# Patient Record
Sex: Male | Born: 1965 | Race: White | Hispanic: No | Marital: Single | Smoking: Never smoker
Health system: Southern US, Community
[De-identification: ages and names within clinical notes are randomized; demographics above are authoritative.]

## PROBLEM LIST (undated history)

## (undated) DIAGNOSIS — I10 Essential (primary) hypertension: Secondary | ICD-10-CM

## (undated) DIAGNOSIS — K219 Gastro-esophageal reflux disease without esophagitis: Secondary | ICD-10-CM

---

## 2000-08-12 ENCOUNTER — Encounter: Payer: Self-pay | Admitting: Emergency Medicine

## 2000-08-12 ENCOUNTER — Emergency Department (HOSPITAL_COMMUNITY): Admission: EM | Admit: 2000-08-12 | Discharge: 2000-08-12 | Payer: Self-pay | Admitting: Emergency Medicine

## 2000-08-15 ENCOUNTER — Encounter: Payer: Self-pay | Admitting: Urology

## 2000-08-15 ENCOUNTER — Ambulatory Visit (HOSPITAL_COMMUNITY): Admission: RE | Admit: 2000-08-15 | Discharge: 2000-08-15 | Payer: Self-pay | Admitting: Urology

## 2000-08-17 ENCOUNTER — Encounter: Payer: Self-pay | Admitting: Urology

## 2000-08-17 ENCOUNTER — Ambulatory Visit (HOSPITAL_COMMUNITY): Admission: RE | Admit: 2000-08-17 | Discharge: 2000-08-17 | Payer: Self-pay | Admitting: Urology

## 2000-08-31 ENCOUNTER — Encounter: Payer: Self-pay | Admitting: Urology

## 2000-08-31 ENCOUNTER — Ambulatory Visit (HOSPITAL_COMMUNITY): Admission: RE | Admit: 2000-08-31 | Discharge: 2000-08-31 | Payer: Self-pay | Admitting: Urology

## 2004-10-27 ENCOUNTER — Ambulatory Visit: Payer: Self-pay | Admitting: Cardiology

## 2004-10-28 ENCOUNTER — Inpatient Hospital Stay (HOSPITAL_COMMUNITY): Admission: EM | Admit: 2004-10-28 | Discharge: 2004-10-28 | Payer: Self-pay | Admitting: Emergency Medicine

## 2016-06-02 ENCOUNTER — Emergency Department (HOSPITAL_BASED_OUTPATIENT_CLINIC_OR_DEPARTMENT_OTHER)
Admission: EM | Admit: 2016-06-02 | Discharge: 2016-06-02 | Disposition: A | Discharge status: Home / Self Care | Payer: BLUE CROSS/BLUE SHIELD | Attending: Emergency Medicine | Admitting: Emergency Medicine

## 2016-06-02 ENCOUNTER — Encounter (HOSPITAL_BASED_OUTPATIENT_CLINIC_OR_DEPARTMENT_OTHER): Payer: Self-pay | Admitting: *Deleted

## 2016-06-02 DIAGNOSIS — Z79899 Other long term (current) drug therapy: Secondary | ICD-10-CM | POA: Diagnosis not present

## 2016-06-02 DIAGNOSIS — I10 Essential (primary) hypertension: Secondary | ICD-10-CM | POA: Insufficient documentation

## 2016-06-02 DIAGNOSIS — M25511 Pain in right shoulder: Secondary | ICD-10-CM

## 2016-06-02 DIAGNOSIS — M79601 Pain in right arm: Secondary | ICD-10-CM | POA: Insufficient documentation

## 2016-06-02 HISTORY — DX: Gastro-esophageal reflux disease without esophagitis: K21.9

## 2016-06-02 HISTORY — DX: Essential (primary) hypertension: I10

## 2016-06-02 MED ORDER — NAPROXEN 500 MG PO TABS: 500.0000 mg | ORAL_TABLET | Freq: Two times a day (BID) | ORAL | Status: AC

## 2016-06-02 MED ORDER — IBUPROFEN 400 MG PO TABS
600.0000 mg | ORAL_TABLET | Freq: Once | ORAL | Status: AC
  Administered 2016-06-02: 600 mg via ORAL
  Filled 2016-06-02: qty 1

## 2016-06-02 NOTE — Discharge Instructions (Signed)
Medications: Naprosyn  Treatment: Take Naprosyn twice daily as prescribed for your pain. Ice your elbow and shoulder 3-4 times daily alternating 20 minutes on, 20 minutes off. Rest your shoulder and elbow as much as possible.  Follow-up: Please follow-up with Dr. Pearletha ForgeHudnall for further evaluation and treatment of your pain. Please return to emergency department if you develop any new or worsening symptoms.

## 2016-06-02 NOTE — ED Notes (Addendum)
Right upper arm pain. States his elbow and shoulder are the worse. Increases with certain movements and lifting. Limited motion.

## 2016-06-02 NOTE — ED Provider Notes (Signed)
CSN: [id number]     Arrival date & time 06/02/16  1902 History   First MD Initiated Contact with Patient 06/02/16 2026     Chief Complaint  Patient presents with  . Arm Pain     (Consider location/radiation/quality/duration/timing/severity/associated sxs/prior Treatment) HPI Comments: Patient is a 50 year old male with history of hypertension and GERD who presents with right arm pain. Patient states that he has had right shoulder pain for the past few months. It hurts especially when he lays on it. Patient states he sleeps on his stomach with his right arm above his head. Patient also reports more acute right elbow pain. Patient reports he was at work this evening and lifted a lunch box and had pain to his upper arm. Patient describes the pain as shooting to his right bicep, up and down his arm only with certain movements. The pain worsens when he supinates and flexes his elbow. Patient works for [employer] and does a lot of heavy lifting at his job. Patient denies any numbness or tingling. Patient is not tried any medications for this at home. Patient denies any injury or trauma to his elbow or shoulder. Patient also denies any chest pain, shortness of breath, abdominal pain, nausea, vomiting, dysuria.  Patient is a 50 y.o. male presenting with arm pain. The history is provided by the patient.  Arm Pain Associated symptoms include arthralgias and myalgias. Pertinent negatives include no abdominal pain, chest pain, chills, fever, headaches, nausea, rash, sore throat or vomiting.    Past Medical History  Diagnosis Date  . Hypertension   . GERD (gastroesophageal reflux disease)    History reviewed. No pertinent past surgical history. No family history on file. Social History  Substance Use Topics  . Smoking status: Never Smoker   . Smokeless tobacco: None  . Alcohol Use: Yes     Comment: monthly    Review of Systems  Constitutional: Negative for fever and chills.  HENT: Negative for facial  swelling and sore throat.   Respiratory: Negative for shortness of breath.   Cardiovascular: Negative for chest pain.  Gastrointestinal: Negative for nausea, vomiting and abdominal pain.  Genitourinary: Negative for dysuria.  Musculoskeletal: Positive for myalgias and arthralgias. Negative for back pain.  Skin: Negative for rash and wound.  Neurological: Negative for headaches.  Psychiatric/Behavioral: The patient is not nervous/anxious.       Allergies  Penicillins  Home Medications   Prior to Admission medications   Medication Sig Start Date End Date Taking? Authorizing Provider  LISINOPRIL-HYDROCHLOROTHIAZIDE PO Take by mouth.   Yes Historical Provider, MD  PANTOPRAZOLE SODIUM PO Take by mouth.   Yes Historical Provider, MD  naproxen (NAPROSYN) 500 MG tablet Take 1 tablet (500 mg total) by mouth 2 (two) times daily. 06/02/16   Rollo Farquhar M Riata Ikeda, PA-C   BP 124/74 mmHg  Pulse 95  Temp(Src) 98.2 F (36.8 C) (Oral)  Resp 20  Ht  (1.702 m)  Wt 79.379 kg  BMI 27.40 kg/m2  SpO2 98% Physical Exam  Constitutional: He appears well-developed and well-nourished. No distress.  HENT:  Head: Normocephalic and atraumatic.  Mouth/Throat: Oropharynx is clear and moist. No oropharyngeal exudate.  Eyes: Conjunctivae are normal. Pupils are equal, round, and reactive to light. Right eye exhibits no discharge. Left eye exhibits no discharge. No scleral icterus.  Neck: Normal range of motion. Neck supple. No thyromegaly present.  Cardiovascular: Normal rate, regular rhythm and normal heart sounds.  Exam reveals no gallop and no friction rub.  No murmur heard. Pulmonary/Chest: Effort normal and breath sounds normal. No stridor. No respiratory distress. He has no wheezes. He has no rales.  Abdominal: Soft. Bowel sounds are normal. He exhibits no distension. There is no tenderness. There is no rebound and no guarding.  Musculoskeletal: He exhibits no edema.       Right shoulder: He exhibits  tenderness. He exhibits normal range of motion and no deformity.       Right elbow: He exhibits swelling. He exhibits normal range of motion, no deformity and no laceration. Tenderness found. No medial epicondyle, no lateral epicondyle and no olecranon process tenderness noted.  Tenderness to medial elbow and biceps tendon; tenderness to anterior shoulder and deltoid; normal sensation to right upper extremity; FROM with mild pain; 5/5 strength to upper extremities; equal bilateral grip strength; mild edema noted to right upper arm in comparison to left; negative empty can test, however pain elbow pain reproduced with this test  Lymphadenopathy:    He has no cervical adenopathy.  Neurological: He is alert. Coordination normal.  Skin: Skin is warm and dry. No rash noted. He is not diaphoretic. No pallor.  Psychiatric: He has a normal mood and affect.  Nursing note and vitals reviewed.   ED Course  Procedures (including critical care time) Labs Review Labs Reviewed - No data to display  Imaging Review No results found. I have personally reviewed and evaluated these images and lab results as part of my medical decision-making.   EKG Interpretation None      MDM   I suspect bicep tendinitis or strain of biceps. Patient has full range of motion decreasing suspicion for biceps rupture. I suspect a tendinitis or rotator cuff etiology with shoulder pain. Imaging would be low yield at this time. Patient neurovascularly intact with full range of motion of both joints with some pain. Ace wrap placed in ED. Patient given ibuprofen and ice in ED. Discharge patient home with Naprosyn and advised to use ice. Patient advised to follow-up with Dr. Pearletha ForgeHudnall for further evaluation and treatment. Patient also advised to rest arm. Patient given a work note to stay out of work until Monday. Patient understands and is in agreement with plan. Patient discussed with Dr. Criss AlvineGoldston who is in agreement with plan.  Patient vitals stable throughout ED course discharged in satisfactory condition.  Final diagnoses:  Right arm pain  Right shoulder pain        Emi Holeslexandra M Alain Deschene, PA-C 06/03/16 0235  Emi HolesAlexandra M Osamah Schmader, PA-C 06/03/16 0236  Pricilla LovelessScott Goldston, MD 06/07/16 [phone]

## 2016-06-13 ENCOUNTER — Ambulatory Visit: Payer: BLUE CROSS/BLUE SHIELD | Admitting: Family Medicine
